# Patient Record
Sex: Male | Born: 1950 | Race: White | Hispanic: No | Marital: Married | Smoking: Former smoker
Health system: Southern US, Community
[De-identification: ages and names within clinical notes are randomized; demographics above are authoritative.]

## PROBLEM LIST (undated history)

## (undated) DIAGNOSIS — I10 Essential (primary) hypertension: Secondary | ICD-10-CM

## (undated) DIAGNOSIS — Z950 Presence of cardiac pacemaker: Secondary | ICD-10-CM

## (undated) DIAGNOSIS — E119 Type 2 diabetes mellitus without complications: Secondary | ICD-10-CM

## (undated) HISTORY — PX: JOINT REPLACEMENT: SHX530

## (undated) HISTORY — PX: HERNIA REPAIR: SHX51

---

## 2001-12-03 ENCOUNTER — Encounter: Payer: Self-pay | Admitting: Specialist

## 2001-12-10 ENCOUNTER — Inpatient Hospital Stay (HOSPITAL_COMMUNITY): Admission: RE | Admit: 2001-12-10 | Discharge: 2001-12-14 | Payer: Self-pay | Admitting: Specialist

## 2008-02-03 ENCOUNTER — Ambulatory Visit: Payer: Self-pay | Admitting: Surgery

## 2008-02-10 ENCOUNTER — Ambulatory Visit: Payer: Self-pay | Admitting: Surgery

## 2011-03-14 NOTE — Assessment & Plan Note (Signed)
OFFICE VISIT   SEANPAUL, PREECE A  DOB:  June 10, 1951                                       02/10/2008  WGNFA#:[id number]   REASON FOR VISIT:  Further evaluation of right leg ulcer.   HISTORY:  This is a 60 year old who I saw last week for evaluation of  ulcer on his right lower extremity.  Patient at that time stated that it  had been present for approximately three years.  He stated that it had  developed when he had an orthopedic procedure, and the tourniquet was  kept up too long.  He has had ulceration and drainage from the wound as  well as bleeding.  He also has significant edema associated with this.  He has developed recurrent open sores with bleeding and drainage, which  had been there for approximately six weeks now.  He has been placed in  compression with minimal results and continues to develop new blisters.  He was placed in an [employer] last week.  He was found to have reflux in  the superficial venous system, and we are working on arranging for  endovenous laser ablation.  He comes back in today for followup.  He has  had recurrent new blistering and ulceration develop.  There is profound  rubor within the leg.  The leg is still markedly swollen.  He is having  difficulty with swelling and wearing his compression due to pain.  At  this time, we are going to place him back in compression and continue to  pursue getting his ablation approved.  I have reiterated the importance  of leg elevation and minimizing him being on his foot.  The patient also  complains of a sore on his foot, which is scheduled to be biopsied.  He  is concerned that it may be a melanoma.  This is going to be managed by  the wound center at this time.   Jorge Ny, MD  Electronically Signed   VWB/MEDQ  D:  02/10/2008  T:  02/11/2008  Job:  [id number]   cc:   Quita Skye. Hart Rochester, M.D.

## 2011-03-14 NOTE — Procedures (Signed)
LOWER EXTREMITY VENOUS REFLUX EXAM   INDICATION:  Right leg edema and ulcer.   EXAM:  Using color-flow imaging and pulse Doppler spectral analysis, the  right femoral, superficial femoral, popliteal, posterior tibial, greater  and lesser saphenous veins are evaluated.  There is no evidence  suggesting deep venous insufficiency in the right lower extremity.   The right saphenofemoral junction is not competent.  The right GSV is  not competent with the caliber as described below.   The right proximal short saphenous vein demonstrates competency.   GSV Diameter (used if found to be incompetent only)                                            Right    Left  Proximal Greater Saphenous Vein           0.84 cm  cm  Proximal-to-mid-thigh                     0.84 cm  cm  Mid thigh                                 0.69 cm  cm  Mid-distal thigh                          0.69 cm  cm  Distal thigh                              0.54 cm  cm  Knee                                      0.65 cm  cm   IMPRESSION:  1. Right greater saphenous vein reflux is identified with the caliber      ranging from 0.65 cm to 0.95 cm knee to groin.  2. The right greater saphenous vein is not aneurysmal.  3. The right greater saphenous vein is not tortuous.  4. The deep venous system is competent.  5. The right lesser saphenous vein is competent.  6. No evidence of deep venous thrombosis noted in the right leg.      ___________________________________________  V. Charlena Cross, MD   MG/MEDQ  D:  02/03/2008  T:  02/03/2008  Job:  [id number]

## 2011-03-14 NOTE — Assessment & Plan Note (Signed)
OFFICE VISIT   RENO, CLASBY A  DOB:  [birth year]                                       02/03/2008  OZHYQ#:[id number]   REASON FOR VISIT:  Right leg ulcer.   REVIEW OF SYSTEMS:  GENERAL:  Positive for weight gain.  CARDIAC:  Positive shortness of breath when lying flat.  Shortness of  breath with exertion.  PULMONARY:  Negative.  GI:  Negative.  GU:  Negative.  VASCULAR:  Pain in the legs with walking, pain in the feet when lying  flat.  Nonhealing ulcers.  No history of pulmonary embolism.  No history  of DVT.  NEURO:  Negative.  ORTHO:  Positive for arthritis, joint pain, and muscle pain.  PSYCH:  Positive for nervousness.  ENT:  Negative.  HEME:  Negative.   PAST MEDICAL HISTORY:  Diabetes since age 53, not on insulin.  Hypercholesterolemia.   PAST SURGICAL HISTORY:  Bilateral knee replacement and umbilical hernia  repair.   FAMILY HISTORY:  He has a brother with a DVT as well as pulmonary  embolism.   SOCIAL HISTORY:  He is married with six children, works as a Administrator,  does not smoke, has a history of smoking, quit 10 years ago.   MEDICATIONS:  1. Mirapex twice daily.  2. Lexapro once daily.  3. Diclofenac twice daily.  4. Diabetic pills??   ALLERGIES:  NERVE MEDICINE??   PHYSICAL EXAMINATION:  Vital signs:  Heart rate 61, blood pressure  154/85.  Generally, he is well-appearing, obese.  No acute distress.  Cardiovascular:  Regular rate and rhythm.  Lungs:  Clear bilaterally.  Abdomen is obese, nontender.  Extremities:  Right leg edema with  dependent rubor.  There are several open areas with active bleeding and  exposed tissue.  A palpable dorsalis pedis pulse.  Again, significant  edema.   DIAGNOSTIC STUDIES:  Patient had a duplex evaluation that shows no  evidence of DVT in the right leg.  There was reflux noted in the right  saphenofemoral junction and the entire greater saphenous vein.   ASSESSMENT:  1. CEAP,  (clinical, etiologic, anatomic, pathophysiologic) class VI,      right leg.  2. Venous disease.   PLAN:  Patient will be placed back in compression wrap today with Unna  boot.  He at this time needs laser ablation of his right greater  saphenous vein, given that it is incompetent and the patient has had  nonhealing bleeding wounds which are recurrent over the past several  years.  I had Dr. Hart Rochester come in and evaluate the patient today.  Pending insurance clearance, we will get him on the schedule for right  greater saphenous vein ablation.   Jorge Ny, MD  Electronically Signed   VWB/MEDQ  D:  02/03/2008  T:  02/04/2008  Job:  [id number]   cc:   Yolanda Bonine

## 2011-03-17 NOTE — Discharge Summary (Signed)
[facility]  Patient:    Adrian Heath, Adrian Heath Visit Number: [id number] MRN: [id number]          Service Type: SUR Location: 4W 0469 01 Attending Physician:  Rocky Crafts Dictated by:   Druscilla Brownie. Shela Nevin, P.A. Admit Date:  12/10/2001 Discharge Date: 12/14/2001   CC:         Alwyn Pea, M.D.   Discharge Summary  ADMITTING DIAGNOSES: 1. Severe osteoarthritis of the right knee. 2. "Borderline hypertension." 3. Hemorrhoids. 4. Back pain.  DISCHARGE DIAGNOSES: 1. Severe osteoarthritis of the right knee. 2. "Borderline hypertension." 3. Hemorrhoids. 4. Back pain. 5. Mild postoperative anemia.  BRIEF HISTORY:  This 60 year old white male with continuing progressive problems concerning his left knee.  He is a very large individual and a lot of stress is put on his right knee with ambulation.  Arthroscopic surgery was done by Dr. Solon Augusta in [place] which gave him some minimal relief.  He has been treated with hydrocortisone for the knee as well as anti-inflammatories which really has not helped at all.  X-rays showed a marked varus deformity of the right knee with bone-on-bone deformity.  He has a similar problem on the left knee, but due to the increasing pain in the right knee, it was decided to go ahead with that knee.  HOSPITAL COURSE:  The patient tolerated the surgical procedure quite well.  He was placed on Coumadin protocol postoperatively for the prevention of DVT and was placed on iron for his anemia.  Wound remained grossly dry on the right. The Hemovac was removed without difficulty.  Due to his considerable amount of pain and discomfort, we placed him on oxycontin for his postoperative pain.  He main complaint was his back.  He has had back pain noted for some time and lying in bed, etc., has markedly increased his back pain.  The patient ran a low grade fever which continued.  He probably might have had a urinary  tract infection.  We repeated his urinalysis and it was negative.  Arrange to have home health in [place] to draw the blood and call Dr. Alwyn Pea to help [place] with the Coumadin protocol.  We plan to have him on Coumadin protocol for six weeks after surgery.  On the day of discharge, the patient had been ambulating in the hall.  He was doing pretty good with the knee.  It was felt that he could be maintained in his home environment and arrangements were made for discharge.  He had achieved ambulation to 140 feet with moderate assist.  He was bending the knee at 60+. Neurovascularly intact of the right lower extremity at discharge.  LABORATORY DATA:  Hematologically showed a hemoglobin and hematocrit preoperatively at 14.5 and 41.4.  Otherwise normal.  Final hemoglobin was 9.5 with a hematocrit of 26.4.  Final pro time was 19.4 and INR was 1.9.  A urinalysis preoperatively and postoperatively was negative for urinary tract infection.  Chest x-ray showed "no active disease."  Electrocardiogram showing sinus rhythm with normal ECG.  CONDITION ON DISCHARGE:  Improved and stable.  DISCHARGE INSTRUCTIONS:  The patient is discharged to home in the care of his family.  He is to have home health to draw his blood, to continue on Coumadin protocol under the direction of Dr. Alwyn Pea here in [place].  He may use dry dressing p.r.n.  Continue his home physical therapy.  He may weightbear as tolerated.  He will return to see  Dr. Michael Litter. Montez Morita about two weeks.  Continue home medications and diet.  DISCHARGE MEDICATIONS: 1. Oxycontin 10 mg #93 q.12h. p.r.n. pain. 2. Skelaxin 400 mg #36 with 1-2 q.4-6h. p.r.n. pain (one refill). 3. Restoril 30 mg #30 with a refill 1 h.s. p.r.n. sleep. 4. OxyIR #60 with 1 q.4-6h. p.r.n. breakthrough pain. Dictated by:   Druscilla Brownie. Shela Nevin, P.A. Attending Physician:  Rocky Crafts DD:  12/14/01 TD:  12/14/01 Job: [id number]

## 2011-03-17 NOTE — Op Note (Signed)
[facility]  Patient:    Adrian Heath, Adrian Heath Visit Number: [id number] MRN: [id number]          Service Type: SUR Location: 4W 0469 01 Attending Physician:  Rocky Crafts Dictated by:   Michael Litter. Montez Morita, M.D. Proc. Date: 11/09/01 Admit Date:  12/10/2001                             Operative Report  PREOPERATIVE DIAGNOSIS:  Degenerative arthritis of right knee.  POSTOPERATIVE DIAGNOSIS:  Degenerative arthritis of right knee.  OPERATIVE PROCEDURE:  Right total knee replacement using an [employer] system with a #13 posteriorly stabilized femoral component, Scorpio, a size 11, 7000 total knee standard tibial tray with a Scorpio tibial bearing insert posteriorly stabilized, 12 mm thick, and a 28 mm recessed patellar component.  SURGEON:  Philips J. Montez Morita, M.D.  ASSISTANT:  Almedia Balls. Ranell Patrick, M.D.  DESCRIPTION OF PROCEDURE:  After suitable general anesthesia, the right knee was prepped and draped routinely, and after exsanguination of the leg, an upper thigh tourniquet was inflated to 350 mmHg.  A midline incision was made with the skin flaps done very deeply under the fascial layers and sewn back with some fascial sutures into the stockinette drape on either side with retractors.  A standard medial parapatellar incision was made revealing severe disease in the knee, removing the anterior two-thirds of the medial meniscus, and releasing the medial side with increasing external rotation, and releasing a little bit in front of the lateral meniscus as well.  The knee was then flexed, the patella everted.  Intercondylar notch was clearest.  Some of the major osteophytes was cleared and a central drill hole was drilled from patella, and the distal femoral cut of 12 mm was taken because of the slight flexion contracture.  Following this, I elected to do the tibial first, as he was quite tight.  The cruciates were released.  Any menisci were  removed.  The tibia was brought forward.  The spine was removed.  A size 11 tibia was found to be ideal, and a central hole was drilled and enlarged, and a 0 cutting guide was brought in, taking 10 mm off the lateral side which turned out to be about the same as 4 mm off the defect.  Following that cut, we then brought in the femoral cutting jig, as it was the new jig from the [employer] people, and adjusted it according to his epicondyles rather than the condyles of the femur, which actually put him in about 4 degrees of external rotation, and these were in the normal _____ using the guide that allowed you to set your external rotation by the epicondyles.  The size 13 was felt to be an excellent cutting size for the femur.  Holes were marked.  Cutting jig was brought in, and the anterior and posterior chamfering cuts were made.  The notch was then cleared of tissue and the cutting jig then brought in to cut the notch for the patella component and then the intercondylar notch.  Stiff tight bone was removed with a rongeur before impacting and a nice impaction carried out.  Good hemostasis again done posteriorly with the Bovie.  The remaining materials were then cleaned up.  A little bit of the osteophytes were removed from the back of both condyles with a curved osteotome, protecting it with a large curved joker.  Trial reduction was then carried out,  realizing that with a 10 mm it was too tight in both flexion and extension.  We elected then to go ahead and cut four more off the tibia so we could get a 12 in.  The 10 could just about get in, so we took four more then off the tibia by dropping the cutting block down.  A second trial which allowed a 12 mm to do just nicely in flexion and extension.  It was not too tight and adequate medial release had been obtained.  No lift off and looked quite good.  Nice hyperextension.  We then drilled off the patella.  The patella bone was unbelievably  thick.  I took a 10 mm recess to accommodate a 28 mm patella.  The patella took a _____ amount of time to get it drilled as it was hooked, thick and heavy.  Then, after wound irrigation and good hemostasis, components were seen and entered with the tibia first, the femur second, and the patella third.  After the introduction of the tibia, before cementing the femur, it appeared as though we had placed it a little off from the external rotation.  It was removed, recemented, and brought back into proper position, so it was done twice with the cement, and then the femur was applied, held until set, and the patella was cemented and held with the clamp.  Throughout the case, we were checking patella stability at each stage, and revealing good stable patella without the need of any lateral releases, and this checked out as well.  Cementing and setting the tourniquet had been up for 2 hours and 9 minutes, and was let down at that point.  Hemostasis was secured and the 10 mm was removed.  The back of the femur was cleared with cement, inspected, and trial reduction was carried out with a 12 mm which revealed excellent stability, a little bit of hyperextension, and great to flexion.  Then, the ________ component was then inserted and was subluxing the tibia forward, applying it and then reducing it.  A bit of 20 cc of 0.25% plain Marcaine with adrenalin was injected in some of the tissue to clean on the medial side, and the wound was closed routinely over a Hemovac with a running suture in the synovium and interrupted in the quadriceps tendon and the medial capsule, the medial joint, 2-0 coated Vicryl in the synovium and a running Monocryl with Steri-Strips in the skin.  A nice compression dressing at the end with support hose, and goes to recovery in good condition. Dictated by:   [employer]. Montez Morita, M.D. Attending Physician:  Rocky Crafts DD:  12/10/01 TD:  12/11/01 Job:  [id number]

## 2011-03-17 NOTE — H&P (Signed)
[facility]  Patient:    Adrian Heath, Adrian Heath. Visit Number: [id number] MRN: [id number]          Service Type: Attending:  Philips J. Montez Morita, M.D. Dictated by:   Alexzandrew L. Perkins, P.A.-C. Adm. Date:  12/10/01                           History and Physical  DATE OF OFFICE HISTORY AND PHYSICAL:  December 06, 2001.  CHIEF COMPLAINT:  Bilateral knee pain, right greater than left.  HISTORY OF PRESENT ILLNESS:  The patient is a 60 year old male who has been seen by Dr. Montez Morita and evaluated for bilateral knee pain.  He has previously undergo arthroscopies by Dr. Cira Servant, an orthopedist in [place], [place].  He was not pleased with his situation and, therefore, followed up with Dr. Montez Morita.  He was told at that time he would have to retire before he could proceed with his total knee replacements.  He has been seen and evaluated by Dr. Montez Morita and found to have bone-on-bone contact with marked varus deformities in both knees.  The right appeared to be worse than the left. It was felt he was an appropriate candidate for a total knee replacement.  He did state that he does have insurance at this time and would like to proceed with his knee replacements.  The risks and benefits of this procedure have been discussed with the patient, and he has elected to proceed with surgery.  ALLERGIES:  "NERVE MEDICATION."  This was during childhood.  He does not recall the medication.  CURRENT MEDICATIONS:  Vicodin and also a cold medication.  PAST MEDICAL HISTORY: 1. Degenerative disk disease. 2. Osteoarthritis of knees. 3. He has had some precancerous skin lesions. 4. Some sciatic pain. 5. History of a foot fracture secondary to a nail puncture. 6. Right ankle fracture in the past.  PAST SURGICAL HISTORY: 1. Knee arthroscopies. 2. Cyst excision from the right axilla. 3. Cyst excision from the jaw area.  SOCIAL HISTORY:  He is married, has six children.  Past  smoker for approximately 35 years.  He did smoke 3 to 3-1/2 packs per day.  He quit 10 years ago.  He stopped drinking approximately five years ago, with a past history of two to three bottles of approximately a fifth of alcohol; this was weekly.  He did stop five years ago.  He works in Therapist, [employer].  FAMILY HISTORY:  He did have a brother deceased in his 28s secondary to a blood clot after surgery.  REVIEW OF SYSTEMS:  GENERAL:  No fevers, chills, or night sweats. RESPIRATORY:  The patient has had a recent cold and mild productive cough.  No shortness of breath.  NEUROLOGIC:  No seizures, syncope, or paralysis. CARDIOVASCULAR:  He has an occasional flutter for several years.  He has had previous workup and a negative EKG in the past.  No chest pain or angina.  No orthopnea.  GASTROINTESTINAL:  No nausea, vomiting, or diarrhea.  He does have some intermittent constipation.  No blood or mucous in the stool. GENITOURINARY:  No dysuria, hematuria, or discharge.  MUSCULOSKELETAL: Pertinent to the knees, found in history of present illness.  PHYSICAL EXAMINATION:  VITAL SIGNS:  Pulse 92, respirations 16, blood pressure 122/84.  GENERAL:  The patient is a 60 year old white male, large-framed individual. He is alert, oriented, and cooperative.  Mildly anxious at the time of exam.  HEENT:  Normocephalic,  atraumatic.  Pupils are round and reactive.  Oropharynx clear.  NECK:  Supple.  CHEST:  Clear.  HEART:  Regular rate and rhythm.  No murmurs appreciated.  ABDOMEN:  Soft, nontender.  Bowel sounds are positive.  RECTAL, BREASTS, GENITOURINARY:  Not done, not pertinent to present illness.  EXTREMITIES:  Limited to that of the right lower extremity.  He does have painful range of motion with moderate crepitus noted on passive range of motion.  He is tender to palpation over the joint lines.  LABORATORY DATA:  X-rays revealed bone-on-bone contact and also marked varus deformity of the  right knee.  IMPRESSION: 1. Osteoarthritis bilateral knees, right more symptomatic than the left. 2. Degenerative disk disease. 3. History of sciatica pain.  PLAN:  The patient will be admitted to [facility] to undergo a right total knee replacement arthroplasty.  Surgery is to be performed by Dr. Ronnell Guadalajara. Dictated by:   Alexzandrew L. Perkins, P.A.-C. Attending:  Philips J. Montez Morita, M.D. DD:  12/15/01 TD:  12/15/01 Job: [id number]

## 2016-02-14 ENCOUNTER — Emergency Department (HOSPITAL_COMMUNITY)
Admission: EM | Admit: 2016-02-14 | Discharge: 2016-02-14 | Disposition: A | Discharge status: Home / Self Care | Payer: Self-pay | Attending: Emergency Medicine | Admitting: Emergency Medicine

## 2016-02-14 ENCOUNTER — Emergency Department (HOSPITAL_COMMUNITY): Payer: Self-pay

## 2016-02-14 ENCOUNTER — Encounter (HOSPITAL_COMMUNITY): Payer: Self-pay

## 2016-02-14 DIAGNOSIS — T148XXA Other injury of unspecified body region, initial encounter: Secondary | ICD-10-CM

## 2016-02-14 DIAGNOSIS — I10 Essential (primary) hypertension: Secondary | ICD-10-CM | POA: Insufficient documentation

## 2016-02-14 DIAGNOSIS — Y9289 Other specified places as the place of occurrence of the external cause: Secondary | ICD-10-CM | POA: Insufficient documentation

## 2016-02-14 DIAGNOSIS — S81001A Unspecified open wound, right knee, initial encounter: Secondary | ICD-10-CM | POA: Insufficient documentation

## 2016-02-14 DIAGNOSIS — S0990XA Unspecified injury of head, initial encounter: Secondary | ICD-10-CM

## 2016-02-14 DIAGNOSIS — S134XXA Sprain of ligaments of cervical spine, initial encounter: Secondary | ICD-10-CM | POA: Insufficient documentation

## 2016-02-14 DIAGNOSIS — S139XXA Sprain of joints and ligaments of unspecified parts of neck, initial encounter: Secondary | ICD-10-CM

## 2016-02-14 DIAGNOSIS — Z23 Encounter for immunization: Secondary | ICD-10-CM | POA: Insufficient documentation

## 2016-02-14 DIAGNOSIS — S80812A Abrasion, left lower leg, initial encounter: Secondary | ICD-10-CM | POA: Insufficient documentation

## 2016-02-14 DIAGNOSIS — S63502A Unspecified sprain of left wrist, initial encounter: Secondary | ICD-10-CM | POA: Insufficient documentation

## 2016-02-14 DIAGNOSIS — Z950 Presence of cardiac pacemaker: Secondary | ICD-10-CM | POA: Insufficient documentation

## 2016-02-14 DIAGNOSIS — S0081XA Abrasion of other part of head, initial encounter: Secondary | ICD-10-CM | POA: Insufficient documentation

## 2016-02-14 DIAGNOSIS — E119 Type 2 diabetes mellitus without complications: Secondary | ICD-10-CM | POA: Insufficient documentation

## 2016-02-14 DIAGNOSIS — S81002A Unspecified open wound, left knee, initial encounter: Secondary | ICD-10-CM | POA: Insufficient documentation

## 2016-02-14 DIAGNOSIS — Z7901 Long term (current) use of anticoagulants: Secondary | ICD-10-CM | POA: Insufficient documentation

## 2016-02-14 DIAGNOSIS — Z8739 Personal history of other diseases of the musculoskeletal system and connective tissue: Secondary | ICD-10-CM | POA: Insufficient documentation

## 2016-02-14 DIAGNOSIS — S51802A Unspecified open wound of left forearm, initial encounter: Secondary | ICD-10-CM | POA: Insufficient documentation

## 2016-02-14 DIAGNOSIS — W108XXA Fall (on) (from) other stairs and steps, initial encounter: Secondary | ICD-10-CM | POA: Insufficient documentation

## 2016-02-14 DIAGNOSIS — Z87891 Personal history of nicotine dependence: Secondary | ICD-10-CM | POA: Insufficient documentation

## 2016-02-14 DIAGNOSIS — Y999 Unspecified external cause status: Secondary | ICD-10-CM | POA: Insufficient documentation

## 2016-02-14 DIAGNOSIS — Y9339 Activity, other involving climbing, rappelling and jumping off: Secondary | ICD-10-CM | POA: Insufficient documentation

## 2016-02-14 DIAGNOSIS — S80811A Abrasion, right lower leg, initial encounter: Secondary | ICD-10-CM | POA: Insufficient documentation

## 2016-02-14 HISTORY — DX: Essential (primary) hypertension: I10

## 2016-02-14 HISTORY — DX: Presence of cardiac pacemaker: Z95.0

## 2016-02-14 HISTORY — DX: Type 2 diabetes mellitus without complications: E11.9

## 2016-02-14 LAB — PROTIME-INR
INR: 1.46 (ref 0.00–1.49)
Prothrombin Time: 17.8 seconds — ABNORMAL HIGH (ref 11.6–15.2)

## 2016-02-14 MED ORDER — HYDROCODONE-ACETAMINOPHEN 5-325 MG PO TABS
1.0000 | ORAL_TABLET | Freq: Once | ORAL | Status: AC
  Administered 2016-02-14: 1 via ORAL
  Filled 2016-02-14: qty 1

## 2016-02-14 MED ORDER — HYDROCODONE-ACETAMINOPHEN 5-325 MG PO TABS: 1.0000 | ORAL_TABLET | Freq: Four times a day (QID) | ORAL | Status: AC | PRN

## 2016-02-14 MED ORDER — TETANUS-DIPHTH-ACELL PERTUSSIS 5-2.5-18.5 LF-MCG/0.5 IM SUSP
0.5000 mL | Freq: Once | INTRAMUSCULAR | Status: AC
  Administered 2016-02-14: 0.5 mL via INTRAMUSCULAR
  Filled 2016-02-14: qty 0.5

## 2016-02-14 NOTE — ED Provider Notes (Signed)
CSN: [id number]     Arrival date & time 02/14/16  1558 History  By signing my name below, I, Linna Darner, attest that this documentation has been prepared under the direction and in the presence of non-physician practitioner, Jaynie Crumble, PA-C. Electronically Signed: Linna Darner, Scribe. 02/14/2016. 7:34 PM.     Chief Complaint  Patient presents with  . Head Injury  . Fall    The history is provided by the patient. No language interpreter was used.     HPI Comments: Adrian Heath is a 65 y.o. male with h/o arthritis, DM, and HTN who presents to the Emergency Department complaining of sudden onset, constant, severe, posterior neck pain s/p falling down 4 steps last night. Pt states that he was holding his pitbull on a leash and the dog dragged him down 4 steps. Pt states that he hit his forehead on cement; he did not lose consciousness but endorses seeing stars. He states that his neck bent backwards when he collided with the cement. He also states that his bilateral wrists bent backwards during the fall; his left wrist hurts much worse than his right. Pt also experienced abrasions to his bilateral legs during the fall. He endorses significant pain exacerbation with palpation to his posterior neck and with twisting of his neck. He endorses dizziness upon standing. Pt uses coumadin for his bilateral legs. He experiences numbness/tingling in his bilateral hands at baseline. Pt does not regularly use medications for pain. He denies nausea, vomiting, or any other associated symptoms. He has a pacemaker. He is unsure of his tetanus status. Pt went to an ER yesterday and did not have head nor neck x-rays taken.  Past Medical History  Diagnosis Date  . Diabetes mellitus without complication (HCC)   . Pacemaker   . Hypertension    Past Surgical History  Procedure Laterality Date  . Hernia repair    . Joint replacement     History reviewed. No pertinent family history. Social History   Substance Use Topics  . Smoking status: Former Games developer  . Smokeless tobacco: None  . Alcohol Use: No    Review of Systems  Gastrointestinal: Negative for nausea and vomiting.  Musculoskeletal: Positive for arthralgias (bilateral wrists) and neck pain.  Skin: Positive for wound.  Neurological: Positive for dizziness. Negative for syncope and numbness.    Allergies  Morphine and related  Home Medications   Prior to Admission medications   Not on File   BP 114/68 mmHg  Pulse 74  Temp(Src) 98.6 F (37 C) (Oral)  Resp 16  Ht  (2.032 m)  SpO2 95% Physical Exam  Constitutional: He is oriented to person, place, and time. He appears well-developed and well-nourished. No distress.  HENT:  Head: Normocephalic and atraumatic.  Several abrasions to forehead  Eyes: Conjunctivae and EOM are normal. Pupils are equal, round, and reactive to light.  Left pupil fixed, irregular. chronic  Neck: Normal range of motion. Neck supple. No tracheal deviation present.  Cardiovascular: Normal rate, regular rhythm and normal heart sounds.   Pulmonary/Chest: Effort normal and breath sounds normal. No respiratory distress. He has no wheezes. He has no rales.  Musculoskeletal: Normal range of motion.  Mild swelling noted to the left wrist. Pain with flexion and extension. Normal hand, elbow, shoulders bilaterally. Midline cervical spine tenderness. No tenderness over thoracic or lumbar spine. Full ROM of bilateral upper and lower extremities oterwise. Pt with peripheral vascular disease, with discoloration to the lower legs, 2+  edema to bilateral lower extremities. Several skin tears including 1 to left forearm, bilateral knees. Distal pulses intact in upper and lower extremities.  Neurological: He is alert and oriented to person, place, and time.  5/5 and equal upper and lower extremity strength bilaterally. Equal grip strength bilaterally. Normal finger to nose and heel to shin. No pronator drift.    Skin: Skin is warm and dry.  Psychiatric: He has a normal mood and affect. His behavior is normal.  Nursing note and vitals reviewed.   ED Course  Procedures (including critical care time)  DIAGNOSTIC STUDIES: Oxygen Saturation is 95% on RA, adequate by my interpretation.    COORDINATION OF CARE: 7:35 PM Discussed treatment plan with pt at bedside and pt agreed to plan.  Labs Review Labs Reviewed  PROTIME-INR - Abnormal; Notable for the following:    Prothrombin Time 17.8 (*)    All other components within normal limits    Imaging Review Ct Head Wo Contrast  02/14/2016  CLINICAL DATA:  Fall, struck forehead on cement step, neck pain EXAM: CT HEAD WITHOUT CONTRAST CT CERVICAL SPINE WITHOUT CONTRAST TECHNIQUE: Multidetector CT imaging of the head and cervical spine was performed following the standard protocol without intravenous contrast. Multiplanar CT image reconstructions of the cervical spine were also generated. COMPARISON:  None. FINDINGS: CT HEAD FINDINGS No evidence of parenchymal hemorrhage or extra-axial fluid collection. No mass lesion, mass effect, or midline shift. No CT evidence of acute infarction. Cerebral volume is within normal limits.  No ventriculomegaly. The visualized paranasal sinuses are essentially clear. The mastoid air cells are unopacified. No evidence of calvarial fracture. CT CERVICAL SPINE FINDINGS Normal cervical lordosis. No evidence of fracture or dislocation. Vertebral body heights are maintained. Dens appears intact. No prevertebral soft tissue swelling. Mild to moderate multilevel degenerative changes. Visualized thyroid is unremarkable. Visualized lung apices are essentially clear. Left subclavian pacemaker lead, incompletely visualized. IMPRESSION: No evidence of acute intracranial abnormality. No evidence of traumatic injury to the cervical spine. Mild to moderate degenerative changes. Electronically Signed   By: Charline Bills M.D.   On: 02/14/2016  18:58   Ct Cervical Spine Wo Contrast  02/14/2016  CLINICAL DATA:  Fall, struck forehead on cement step, neck pain EXAM: CT HEAD WITHOUT CONTRAST CT CERVICAL SPINE WITHOUT CONTRAST TECHNIQUE: Multidetector CT imaging of the head and cervical spine was performed following the standard protocol without intravenous contrast. Multiplanar CT image reconstructions of the cervical spine were also generated. COMPARISON:  None. FINDINGS: CT HEAD FINDINGS No evidence of parenchymal hemorrhage or extra-axial fluid collection. No mass lesion, mass effect, or midline shift. No CT evidence of acute infarction. Cerebral volume is within normal limits.  No ventriculomegaly. The visualized paranasal sinuses are essentially clear. The mastoid air cells are unopacified. No evidence of calvarial fracture. CT CERVICAL SPINE FINDINGS Normal cervical lordosis. No evidence of fracture or dislocation. Vertebral body heights are maintained. Dens appears intact. No prevertebral soft tissue swelling. Mild to moderate multilevel degenerative changes. Visualized thyroid is unremarkable. Visualized lung apices are essentially clear. Left subclavian pacemaker lead, incompletely visualized. IMPRESSION: No evidence of acute intracranial abnormality. No evidence of traumatic injury to the cervical spine. Mild to moderate degenerative changes. Electronically Signed   By: Charline Bills M.D.   On: 02/14/2016 18:58   I have personally reviewed and evaluated these images and lab results as part of my medical decision-making.   EKG Interpretation None      MDM   Final diagnoses:  Head injury, initial encounter  Cervical sprain, initial encounter  Left wrist sprain, initial encounter  Multiple skin tears   Patient emergency department after a fall yesterday. Patient is taking Coumadin. He hit his head is complaining mainly of neck pain and left wrist pain. He was seen at another ED yesterday and had x-rays of the neck done that he  states were normal. Today we will perform CT head and cervical spine for evaluation as well as x-ray of the left wrist.  CT and x-rays are all negative. Discussed with Dr. Jeraldine LootsLockwood who has seen patient as well. Will discharge home with pain medications, wound care for his skin tears. Follow-up with primary care doctor for recheck. Return precautions discussed. Patient otherwise in no acute distress and is neurovascular intact  Filed Vitals:   02/14/16 1718 02/14/16 2016  BP: 114/68 129/74  Pulse: 74 78  Temp: 98.6 F (37 C) 98.4 F (36.9 C)  TempSrc: Oral Oral  Resp: 16 16  Height: 6\' 8"  (2.032 m)   SpO2: 95% 99%   I personally performed the services described in this documentation, which was scribed in my presence. The recorded information has been reviewed and is accurate.   Jaynie Crumbleatyana Josep Luviano, PA-C 02/14/16 2340  Gerhard Munchobert Lockwood, MD 02/14/16 [phone]

## 2016-02-14 NOTE — ED Notes (Signed)
Pt climbing up steps, was pulled down by his dog yesterday. States he struck his head on the cement. Has a lac to L hand. Pt complaining of tenderness to cervical spine.

## 2016-02-14 NOTE — Discharge Instructions (Signed)
Take norco as prescribed as needed for severe pain. Bacitracin twice a day to the skin tears. You can try melatonin for sleep. Follow up with your doctor as needed.    Cervical Sprain A cervical sprain is an injury in the neck in which the strong, fibrous tissues (ligaments) that connect your neck bones stretch or tear. Cervical sprains can range from mild to severe. Severe cervical sprains can cause the neck vertebrae to be unstable. This can lead to damage of the spinal cord and can result in serious nervous system problems. The amount of time it takes for a cervical sprain to get better depends on the cause and extent of the injury. Most cervical sprains heal in 1 to 3 weeks. CAUSES  Severe cervical sprains may be caused by:   Contact sport injuries (such as from football, rugby, wrestling, hockey, auto racing, gymnastics, diving, martial arts, or boxing).   Motor vehicle collisions.   Whiplash injuries. This is an injury from a sudden forward and backward whipping movement of the head and neck.  Falls.  Mild cervical sprains may be caused by:   Being in an awkward position, such as while cradling a telephone between your ear and shoulder.   Sitting in a chair that does not offer proper support.   Working at a poorly Marketing executive station.   Looking up or down for long periods of time.  SYMPTOMS   Pain, soreness, stiffness, or a burning sensation in the front, back, or sides of the neck. This discomfort may develop immediately after the injury or slowly, 24 hours or more after the injury.   Pain or tenderness directly in the middle of the back of the neck.   Shoulder or upper back pain.   Limited ability to move the neck.   Headache.   Dizziness.   Weakness, numbness, or tingling in the hands or arms.   Muscle spasms.   Difficulty swallowing or chewing.   Tenderness and swelling of the neck.  DIAGNOSIS  Most of the time your health care provider  can diagnose a cervical sprain by taking your history and doing a physical exam. Your health care provider will ask about previous neck injuries and any known neck problems, such as arthritis in the neck. X-rays may be taken to find out if there are any other problems, such as with the bones of the neck. Other tests, such as a CT scan or MRI, may also be needed.  TREATMENT  Treatment depends on the severity of the cervical sprain. Mild sprains can be treated with rest, keeping the neck in place (immobilization), and pain medicines. Severe cervical sprains are immediately immobilized. Further treatment is done to help with pain, muscle spasms, and other symptoms and may include:  Medicines, such as pain relievers, numbing medicines, or muscle relaxants.   Physical therapy. This may involve stretching exercises, strengthening exercises, and posture training. Exercises and improved posture can help stabilize the neck, strengthen muscles, and help stop symptoms from returning.  HOME CARE INSTRUCTIONS   Put ice on the injured area.   Put ice in a plastic bag.   Place a towel between your skin and the bag.   Leave the ice on for 15-20 minutes, 3-4 times a day.   If your injury was severe, you may have been given a cervical collar to wear. A cervical collar is a two-piece collar designed to keep your neck from moving while it heals.  Do not remove the collar  unless instructed by your health care provider.  If you have long hair, keep it outside of the collar.  Ask your health care provider before making any adjustments to your collar. Minor adjustments may be required over time to improve comfort and reduce pressure on your chin or on the back of your head.  Ifyou are allowed to remove the collar for cleaning or bathing, follow your health care provider's instructions on how to do so safely.  Keep your collar clean by wiping it with mild soap and water and drying it completely. If the  collar you have been given includes removable pads, remove them every 1-2 days and hand wash them with soap and water. Allow them to air dry. They should be completely dry before you wear them in the collar.  If you are allowed to remove the collar for cleaning and bathing, wash and dry the skin of your neck. Check your skin for irritation or sores. If you see any, tell your health care provider.  Do not drive while wearing the collar.   Only take over-the-counter or prescription medicines for pain, discomfort, or fever as directed by your health care provider.   Keep all follow-up appointments as directed by your health care provider.   Keep all physical therapy appointments as directed by your health care provider.   Make any needed adjustments to your workstation to promote good posture.   Avoid positions and activities that make your symptoms worse.   Warm up and stretch before being active to help prevent problems.  SEEK MEDICAL CARE IF:   Your pain is not controlled with medicine.   You are unable to decrease your pain medicine over time as planned.   Your activity level is not improving as expected.  SEEK IMMEDIATE MEDICAL CARE IF:   You develop any bleeding.  You develop stomach upset.  You have signs of an allergic reaction to your medicine.   Your symptoms get worse.   You develop new, unexplained symptoms.   You have numbness, tingling, weakness, or paralysis in any part of your body.  MAKE SURE YOU:   Understand these instructions.  Will watch your condition.  Will get help right away if you are not doing well or get worse.   This information is not intended to replace advice given to you by your health care provider. Make sure you discuss any questions you have with your health care provider.   Document Released: 08/13/2007 Document Revised: 10/21/2013 Document Reviewed: 04/23/2013 Elsevier Interactive Patient Education 2016 Elsevier  Inc. Wrist Sprain A wrist sprain is a stretch or tear in the strong, fibrous tissues (ligaments) that connect your wrist bones. The ligaments of your wrist may be easily sprained. There are three types of wrist sprains.  Grade 1. The ligament is not stretched or torn, but the sprain causes pain.  Grade 2. The ligament is stretched or partially torn. You may be able to move your wrist, but not very much.  Grade 3. The ligament or muscle completely tears. You may find it difficult or extremely painful to move your wrist even a little. CAUSES Often, wrist sprains are a result of a fall or an injury. The force of the impact causes the fibers of your ligament to stretch too much or tear. Common causes of wrist sprains include:  Overextending your wrist while catching a ball with your hands.  Repetitive or strenuous extension or bending of your wrist.  Landing on your hand during  a fall. RISK FACTORS  Having previous wrist injuries.  Playing contact sports, such as boxing or wrestling.  Participating in activities in which falling is common.  Having poor wrist strength and flexibility. SIGNS AND SYMPTOMS  Wrist pain.  Wrist tenderness.  Inflammation or bruising of the wrist area.  Hearing a "pop" or feeling a tear at the time of the injury.  Decreased wrist movement due to pain, stiffness, or weakness. DIAGNOSIS Your health care provider will examine your wrist. In some cases, an X-ray will be taken to make sure you did not break any bones. If your health care provider thinks that you tore a ligament, he or she may order an MRI of your wrist. TREATMENT Treatment involves resting and icing your wrist. You may also need to take pain medicines to help lessen pain and inflammation. Your health care provider may recommend keeping your wrist still (immobilized) with a splint to help your sprain heal. When the splint is no longer necessary, you may need to perform strengthening and  stretching exercises. These exercises help you to regain strength and full range of motion in your wrist. Surgery is not usually needed for wrist sprains unless the ligament completely tears. HOME CARE INSTRUCTIONS  Rest your wrist. Do not do things that cause pain.  Wear your wrist splint as directed by your health care provider.  Take medicines only as directed by your health care provider.  To ease pain and swelling, apply ice to the injured area.  Put ice in a plastic bag.  Place a towel between your skin and the bag.  Leave the ice on for 20 minutes, 2-3 times a day. SEEK MEDICAL CARE IF:  Your pain, discomfort, or swelling gets worse even with treatment.  You feel sudden numbness in your hand.   This information is not intended to replace advice given to you by your health care provider. Make sure you discuss any questions you have with your health care provider.   Document Released: 06/19/2014 Document Reviewed: 06/19/2014 Elsevier Interactive Patient Education [employer]2016 [employer] Inc[employer]

## 2017-08-28 IMAGING — CT CT HEAD W/O CM
3 of 5 series · 14 of 47 positions shown, 16 images · non-contrast
Comparison: None.

CLINICAL DATA: Fall, struck forehead on cement step, neck pain

EXAM:
CT HEAD WITHOUT CONTRAST
CT CERVICAL SPINE WITHOUT CONTRAST
TECHNIQUE: Multidetector CT imaging of the head and cervical spine was
performed following the standard protocol without intravenous
contrast. Multiplanar CT image reconstructions of the cervical spine
were also generated.

[Series 3: c_spine 2.0 st · axial · 0.36mm/px · z∈[-309,-81]mm · 8 of 134 slices shown, 10 images]
[im 10/134  brain]
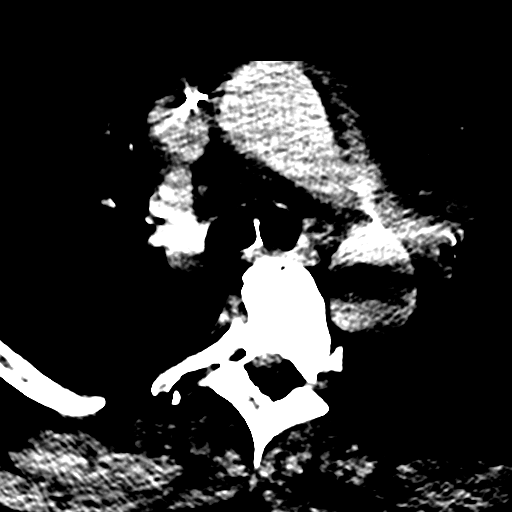
[im 10/134  bone]
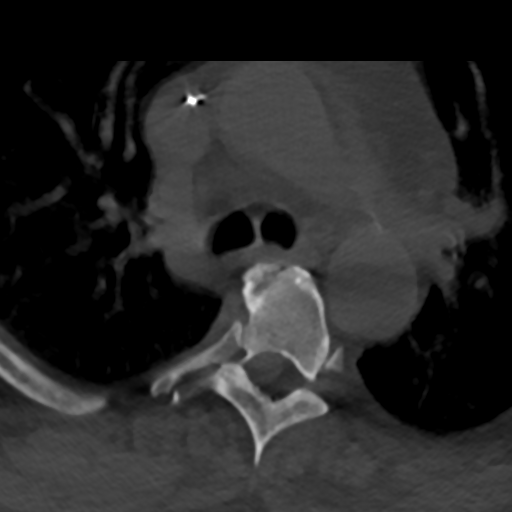
[im 29/134  brain]
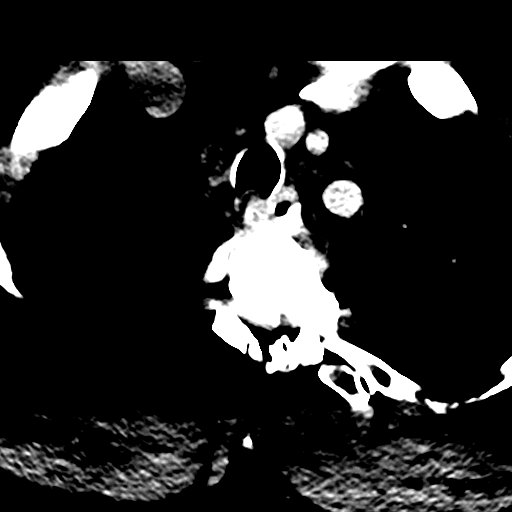
[im 48/134  brain]
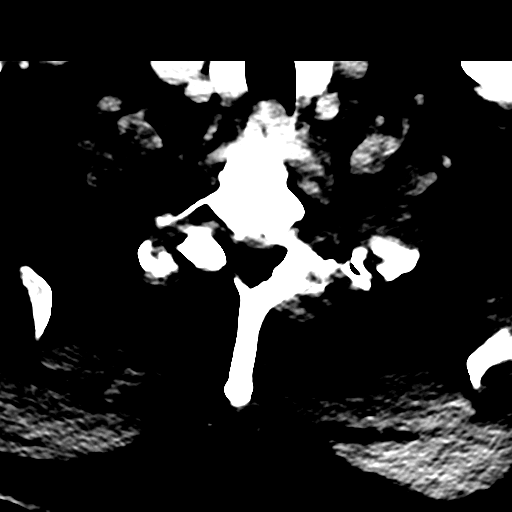
[im 58/134  brain]
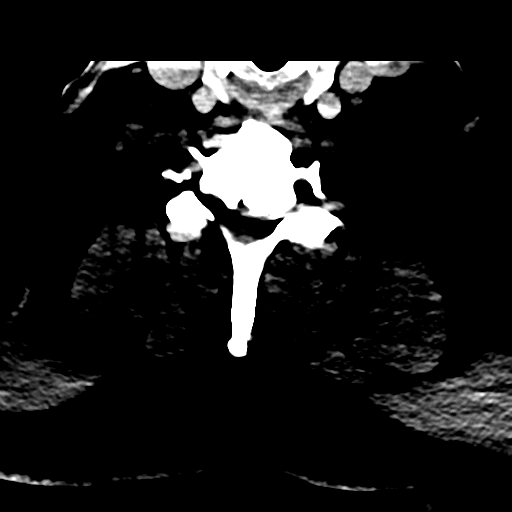
[im 77/134  brain]
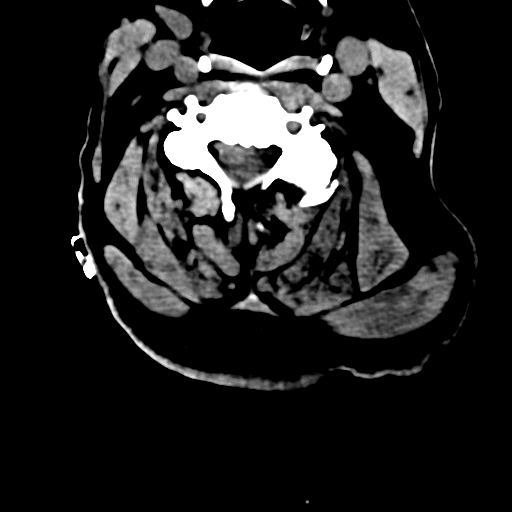
[im 77/134  bone]
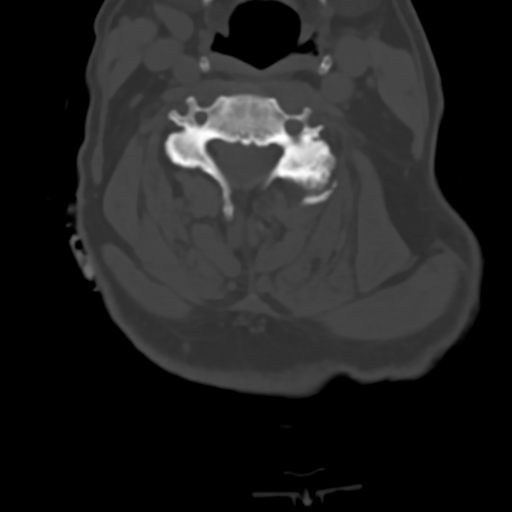
[im 86/134  brain]
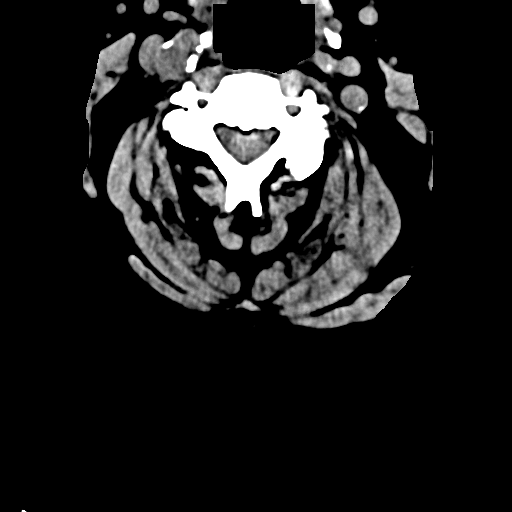
[im 105/134  brain]
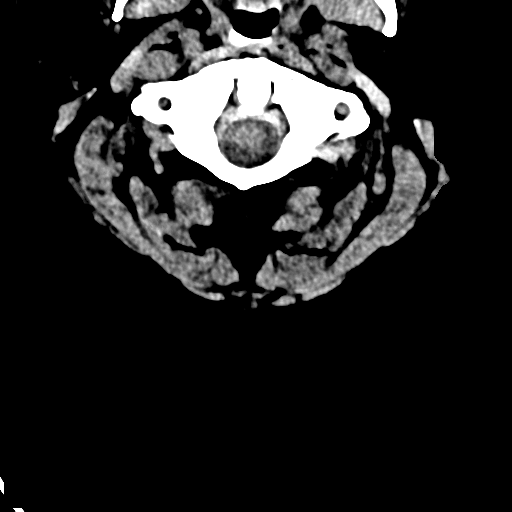
[im 124/134  brain]
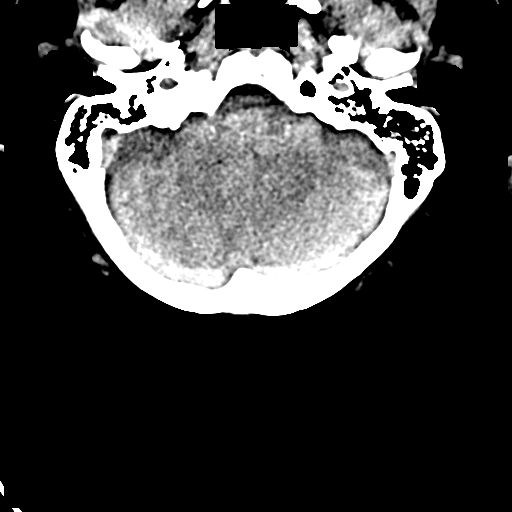

[Series 5: c_spine 2.0 sag bone · sagittal · 0.39mm/px · 3 of 78 slices shown]
[im 26/78  brain]
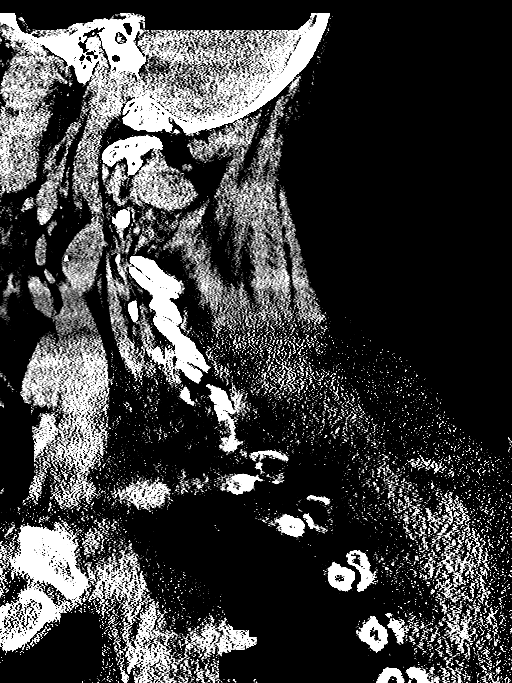
[im 39/78  brain]
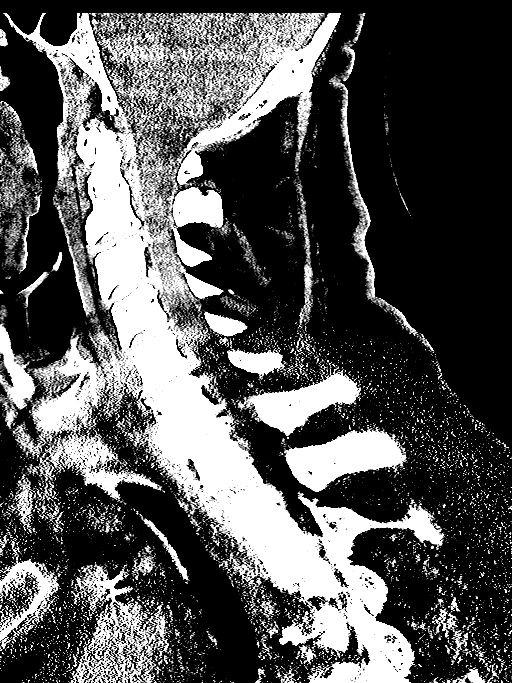
[im 52/78  brain]
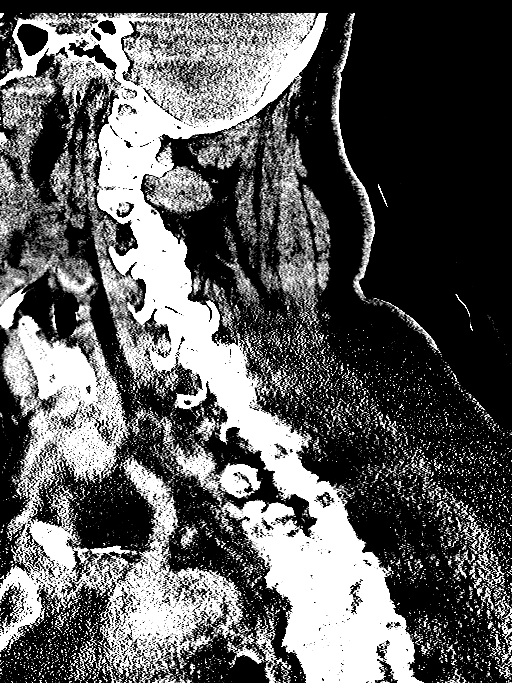

[Series 6: c_spine 2.0 cor bone · coronal · 0.39mm/px · 3 of 97 slices shown]
[im 13/97  brain]
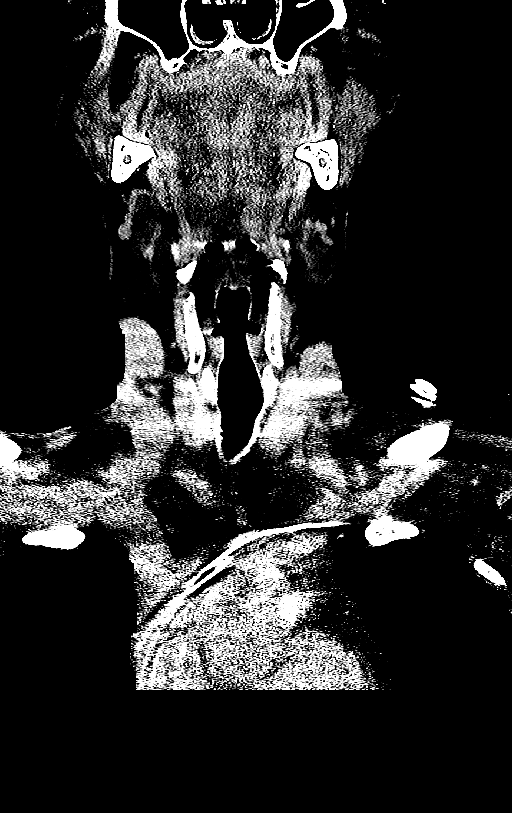
[im 26/97  brain]
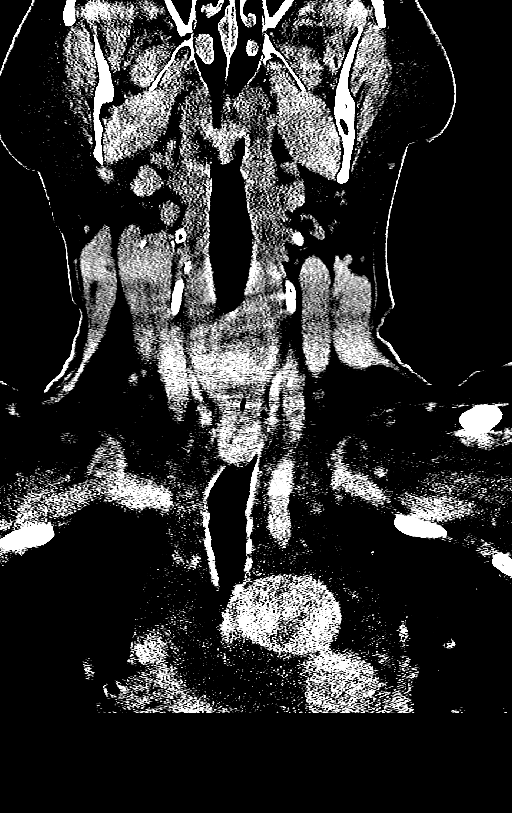
[im 39/97  brain]
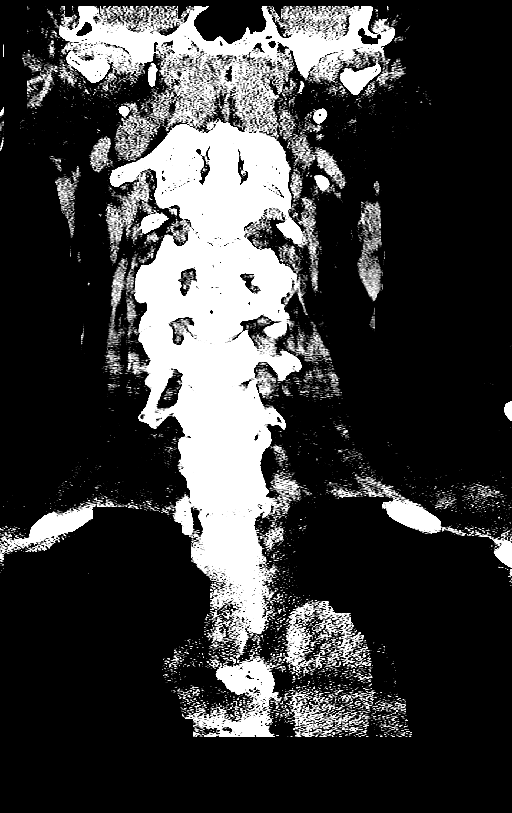

[14 of 47 positions shown; findings below may reference images not displayed]

FINDINGS: CT HEAD FINDINGS

No evidence of parenchymal hemorrhage or extra-axial fluid
collection. No mass lesion, mass effect, or midline shift.

No CT evidence of acute infarction.

Cerebral volume is within normal limits.  No ventriculomegaly.

The visualized paranasal sinuses are essentially clear. The mastoid
air cells are unopacified.

No evidence of calvarial fracture.

CT CERVICAL SPINE FINDINGS

Normal cervical lordosis.

No evidence of fracture or dislocation. Vertebral body heights are
maintained. Dens appears intact.

No prevertebral soft tissue swelling.

Mild to moderate multilevel degenerative changes.

Visualized thyroid is unremarkable.

Visualized lung apices are essentially clear. Left subclavian
pacemaker lead, incompletely visualized.
IMPRESSION: No evidence of acute intracranial abnormality.

No evidence of traumatic injury to the cervical spine. Mild to
moderate degenerative changes.
